# Patient Record
Sex: Male | Born: 1950 | Race: Black or African American | Hispanic: No | Marital: Single
Health system: Southern US, Community
[De-identification: ages and names within clinical notes are randomized; demographics above are authoritative.]

---

## 2011-12-19 ENCOUNTER — Ambulatory Visit: Payer: Self-pay

## 2012-10-06 ENCOUNTER — Ambulatory Visit: Payer: Self-pay | Admitting: Radiation Oncology

## 2012-10-17 ENCOUNTER — Ambulatory Visit: Payer: Self-pay | Admitting: Radiation Oncology

## 2012-11-17 ENCOUNTER — Ambulatory Visit: Payer: Self-pay | Admitting: Radiation Oncology

## 2013-03-26 ENCOUNTER — Ambulatory Visit: Payer: Self-pay | Admitting: Radiation Oncology

## 2013-04-19 ENCOUNTER — Ambulatory Visit: Payer: Self-pay | Admitting: Radiation Oncology

## 2013-04-24 ENCOUNTER — Inpatient Hospital Stay: Payer: Self-pay | Admitting: Internal Medicine

## 2013-04-24 LAB — URINALYSIS, COMPLETE
Bilirubin,UR: NEGATIVE
Blood: NEGATIVE
Glucose,UR: NEGATIVE mg/dL (ref 0–75)
Ketone: NEGATIVE
LEUKOCYTE ESTERASE: NEGATIVE
NITRITE: NEGATIVE
Ph: 5 (ref 4.5–8.0)
Protein: 30
RBC, UR: NONE SEEN /HPF (ref 0–5)
Specific Gravity: 1.021 (ref 1.003–1.030)

## 2013-04-24 LAB — CBC
HCT: 34 % — AB (ref 40.0–52.0)
HGB: 11.5 g/dL — ABNORMAL LOW (ref 13.0–18.0)
MCH: 27.5 pg (ref 26.0–34.0)
MCHC: 33.9 g/dL (ref 32.0–36.0)
MCV: 81 fL (ref 80–100)
Platelet: 292 10*3/uL (ref 150–440)
RBC: 4.19 10*6/uL — ABNORMAL LOW (ref 4.40–5.90)
RDW: 17.4 % — ABNORMAL HIGH (ref 11.5–14.5)
WBC: 7.6 10*3/uL (ref 3.8–10.6)

## 2013-04-24 LAB — BASIC METABOLIC PANEL
Anion Gap: 16 (ref 7–16)
BUN: 49 mg/dL — AB (ref 7–18)
CALCIUM: 9.9 mg/dL (ref 8.5–10.1)
CHLORIDE: 97 mmol/L — AB (ref 98–107)
CREATININE: 3.04 mg/dL — AB (ref 0.60–1.30)
Co2: 20 mmol/L — ABNORMAL LOW (ref 21–32)
EGFR (African American): 25 — ABNORMAL LOW
EGFR (Non-African Amer.): 21 — ABNORMAL LOW
Glucose: 246 mg/dL — ABNORMAL HIGH (ref 65–99)
Osmolality: 288 (ref 275–301)
Potassium: 3.2 mmol/L — ABNORMAL LOW (ref 3.5–5.1)
Sodium: 133 mmol/L — ABNORMAL LOW (ref 136–145)

## 2013-04-24 LAB — TROPONIN I: Troponin-I: <0.02 ng/mL

## 2013-04-25 LAB — BASIC METABOLIC PANEL
Anion Gap: 12 (ref 7–16)
BUN: 48 mg/dL — AB (ref 7–18)
CO2: 17 mmol/L — AB (ref 21–32)
CREATININE: 2.46 mg/dL — AB (ref 0.60–1.30)
Calcium, Total: 9.6 mg/dL (ref 8.5–10.1)
Chloride: 105 mmol/L (ref 98–107)
GFR CALC AF AMER: 32 — AB
GFR CALC NON AF AMER: 27 — AB
Glucose: 96 mg/dL (ref 65–99)
OSMOLALITY: 281 (ref 275–301)
Potassium: 3.8 mmol/L (ref 3.5–5.1)
SODIUM: 134 mmol/L — AB (ref 136–145)

## 2013-04-25 LAB — CBC WITH DIFFERENTIAL/PLATELET
Basophil #: 0 10*3/uL (ref 0.0–0.1)
Basophil %: 0.6 %
Eosinophil #: 0 10*3/uL (ref 0.0–0.7)
Eosinophil %: 0.4 %
HCT: 29.3 % — AB (ref 40.0–52.0)
HGB: 9.8 g/dL — AB (ref 13.0–18.0)
LYMPHS ABS: 1.2 10*3/uL (ref 1.0–3.6)
Lymphocyte %: 19.1 %
MCH: 26.6 pg (ref 26.0–34.0)
MCHC: 33.3 g/dL (ref 32.0–36.0)
MCV: 80 fL (ref 80–100)
MONO ABS: 0.3 x10 3/mm (ref 0.2–1.0)
MONOS PCT: 4.6 %
NEUTROS ABS: 4.6 10*3/uL (ref 1.4–6.5)
Neutrophil %: 75.3 %
Platelet: 224 10*3/uL (ref 150–440)
RBC: 3.67 10*6/uL — AB (ref 4.40–5.90)
RDW: 17 % — ABNORMAL HIGH (ref 11.5–14.5)
WBC: 6.2 10*3/uL (ref 3.8–10.6)

## 2013-04-26 LAB — BASIC METABOLIC PANEL
ANION GAP: 7 (ref 7–16)
BUN: 36 mg/dL — ABNORMAL HIGH (ref 7–18)
CHLORIDE: 112 mmol/L — AB (ref 98–107)
CO2: 17 mmol/L — AB (ref 21–32)
Calcium, Total: 9 mg/dL (ref 8.5–10.1)
Creatinine: 1.06 mg/dL (ref 0.60–1.30)
EGFR (African American): >60
EGFR (Non-African Amer.): >60
Glucose: 83 mg/dL (ref 65–99)
Osmolality: 279 (ref 275–301)
POTASSIUM: 4.9 mmol/L (ref 3.5–5.1)
SODIUM: 136 mmol/L (ref 136–145)

## 2013-06-17 ENCOUNTER — Ambulatory Visit: Payer: Self-pay | Admitting: Internal Medicine

## 2013-07-10 ENCOUNTER — Inpatient Hospital Stay: Payer: Self-pay | Admitting: Internal Medicine

## 2013-07-10 LAB — CBC WITH DIFFERENTIAL/PLATELET
BANDS NEUTROPHIL: 5 %
EOS PCT: 1 %
HCT: 11.5 % — AB (ref 40.0–52.0)
HGB: 3.7 g/dL — CL (ref 13.0–18.0)
LYMPHS PCT: 16 %
MCH: 25.9 pg — ABNORMAL LOW (ref 26.0–34.0)
MCHC: 31.9 g/dL — AB (ref 32.0–36.0)
MCV: 81 fL (ref 80–100)
Monocytes: 3 %
Myelocyte: 1 %
NRBC/100 WBC: 1 /
PLATELETS: 121 10*3/uL — AB (ref 150–440)
RBC: 1.42 10*6/uL — ABNORMAL LOW (ref 4.40–5.90)
RDW: 19.2 % — AB (ref 11.5–14.5)
SEGMENTED NEUTROPHILS: 74 %
WBC: 5.4 10*3/uL (ref 3.8–10.6)

## 2013-07-10 LAB — BASIC METABOLIC PANEL
ANION GAP: 9 (ref 7–16)
BUN: 30 mg/dL — ABNORMAL HIGH (ref 7–18)
CALCIUM: 8.4 mg/dL — AB (ref 8.5–10.1)
CHLORIDE: 102 mmol/L (ref 98–107)
CREATININE: 0.86 mg/dL (ref 0.60–1.30)
Co2: 24 mmol/L (ref 21–32)
EGFR (African American): >60
EGFR (Non-African Amer.): >60
Glucose: 125 mg/dL — ABNORMAL HIGH (ref 65–99)
OSMOLALITY: 278 (ref 275–301)
Potassium: 4 mmol/L (ref 3.5–5.1)
Sodium: 135 mmol/L — ABNORMAL LOW (ref 136–145)

## 2013-07-10 LAB — HEPATIC FUNCTION PANEL A (ARMC)
AST: 62 U/L — AB (ref 15–37)
Albumin: 2.1 g/dL — ABNORMAL LOW (ref 3.4–5.0)
Alkaline Phosphatase: 422 U/L — ABNORMAL HIGH
BILIRUBIN DIRECT: 0.1 mg/dL (ref 0.00–0.20)
Bilirubin,Total: 0.3 mg/dL (ref 0.2–1.0)
SGPT (ALT): 11 U/L — ABNORMAL LOW (ref 12–78)
Total Protein: 6.9 g/dL (ref 6.4–8.2)

## 2013-07-11 LAB — CBC WITH DIFFERENTIAL/PLATELET
BANDS NEUTROPHIL: 4 %
BASOS ABS: 1 %
BASOS ABS: 0 10*3/uL (ref 0.0–0.1)
Basophil %: 0.6 %
EOS PCT: 1 %
Eosinophil #: 0 10*3/uL (ref 0.0–0.7)
Eosinophil %: 0.5 %
HCT: 21.2 % — ABNORMAL LOW (ref 40.0–52.0)
HCT: 23.2 % — ABNORMAL LOW (ref 40.0–52.0)
HGB: 7 g/dL — ABNORMAL LOW (ref 13.0–18.0)
HGB: 7.7 g/dL — AB (ref 13.0–18.0)
LYMPHS ABS: 0.6 10*3/uL — AB (ref 1.0–3.6)
Lymphocyte %: 11.3 %
Lymphocytes: 10 %
MCH: 29.2 pg (ref 26.0–34.0)
MCH: 29.4 pg (ref 26.0–34.0)
MCHC: 33.1 g/dL (ref 32.0–36.0)
MCHC: 33.3 g/dL (ref 32.0–36.0)
MCV: 88 fL (ref 80–100)
MCV: 88 fL (ref 80–100)
MONOS PCT: 10 %
Metamyelocyte: 4 %
Monocyte #: 0.2 x10 3/mm (ref 0.2–1.0)
Monocyte %: 4.4 %
Myelocyte: 3 %
Neutrophil #: 4.6 10*3/uL (ref 1.4–6.5)
Neutrophil %: 83.2 %
Platelet: 88 10*3/uL — ABNORMAL LOW (ref 150–440)
Platelet: 100 10*3/uL — ABNORMAL LOW (ref 150–440)
RBC: 2.4 10*6/uL — ABNORMAL LOW (ref 4.40–5.90)
RBC: 2.63 10*6/uL — ABNORMAL LOW (ref 4.40–5.90)
RDW: 17.4 % — ABNORMAL HIGH (ref 11.5–14.5)
RDW: 17 % — ABNORMAL HIGH (ref 11.5–14.5)
SEGMENTED NEUTROPHILS: 67 %
WBC: 5.5 10*3/uL (ref 3.8–10.6)
WBC: 5.5 10*3/uL (ref 3.8–10.6)

## 2013-07-11 LAB — BASIC METABOLIC PANEL
Anion Gap: 8 (ref 7–16)
BUN: 23 mg/dL — ABNORMAL HIGH (ref 7–18)
CHLORIDE: 107 mmol/L (ref 98–107)
Calcium, Total: 8.6 mg/dL (ref 8.5–10.1)
Co2: 22 mmol/L (ref 21–32)
Creatinine: 0.73 mg/dL (ref 0.60–1.30)
EGFR (African American): >60
EGFR (Non-African Amer.): >60
Glucose: 89 mg/dL (ref 65–99)
Osmolality: 277 (ref 275–301)
POTASSIUM: 3.7 mmol/L (ref 3.5–5.1)
Sodium: 137 mmol/L (ref 136–145)

## 2013-07-12 LAB — CBC WITH DIFFERENTIAL/PLATELET
BANDS NEUTROPHIL: 6 %
EOS PCT: 2 %
HCT: 21.8 % — AB (ref 40.0–52.0)
HGB: 7.2 g/dL — ABNORMAL LOW (ref 13.0–18.0)
LYMPHS PCT: 15 %
MCH: 28.8 pg (ref 26.0–34.0)
MCHC: 32.9 g/dL (ref 32.0–36.0)
MCV: 88 fL (ref 80–100)
MONOS PCT: 5 %
Metamyelocyte: 3 %
Myelocyte: 2 %
Platelet: 86 10*3/uL — ABNORMAL LOW (ref 150–440)
RBC: 2.49 10*6/uL — ABNORMAL LOW (ref 4.40–5.90)
RDW: 17.8 % — ABNORMAL HIGH (ref 11.5–14.5)
Segmented Neutrophils: 67 %
WBC: 4.7 10*3/uL (ref 3.8–10.6)

## 2013-07-17 ENCOUNTER — Ambulatory Visit: Payer: Self-pay | Admitting: Internal Medicine

## 2013-08-17 DEATH — deceased

## 2013-09-29 ENCOUNTER — Ambulatory Visit: Payer: Self-pay | Admitting: Radiation Oncology

## 2013-11-08 IMAGING — CR RIGHT FOOT COMPLETE - 3+ VIEW
1 series · 3 of 3 positions shown · non-contrast
Comparison: none

REASON FOR EXAM: prostate cancer with unknown stage
COMMENTS:

PROCEDURE:     DXR - DXR FOOT RT COMPLETE W/OBLIQUES  - December 19, 2011 [DATE]
RESULT:     Comparison:  None

[Series 1: ap · 0.17mm/px · 3 of 3 slices shown]
[im 1/3]
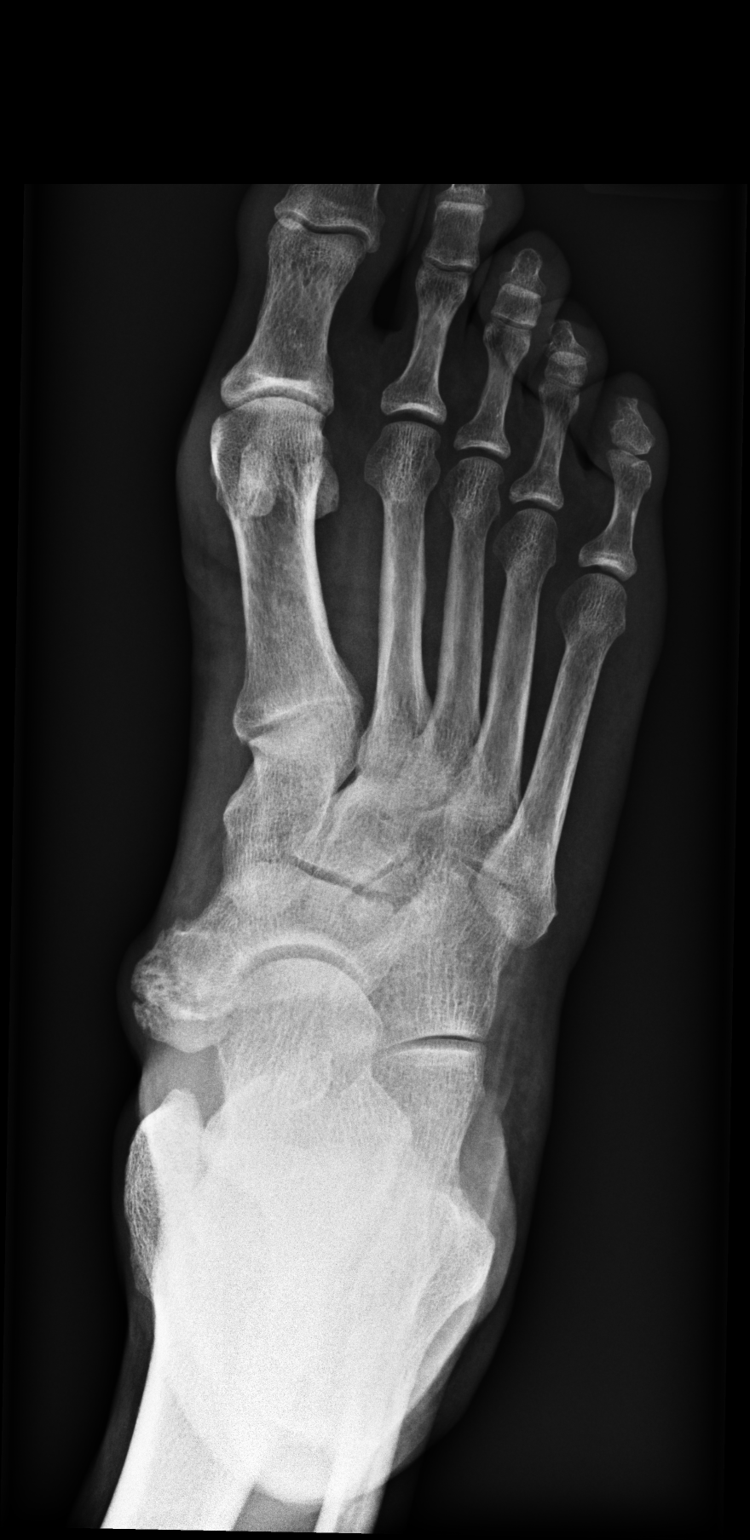
[im 2/3]
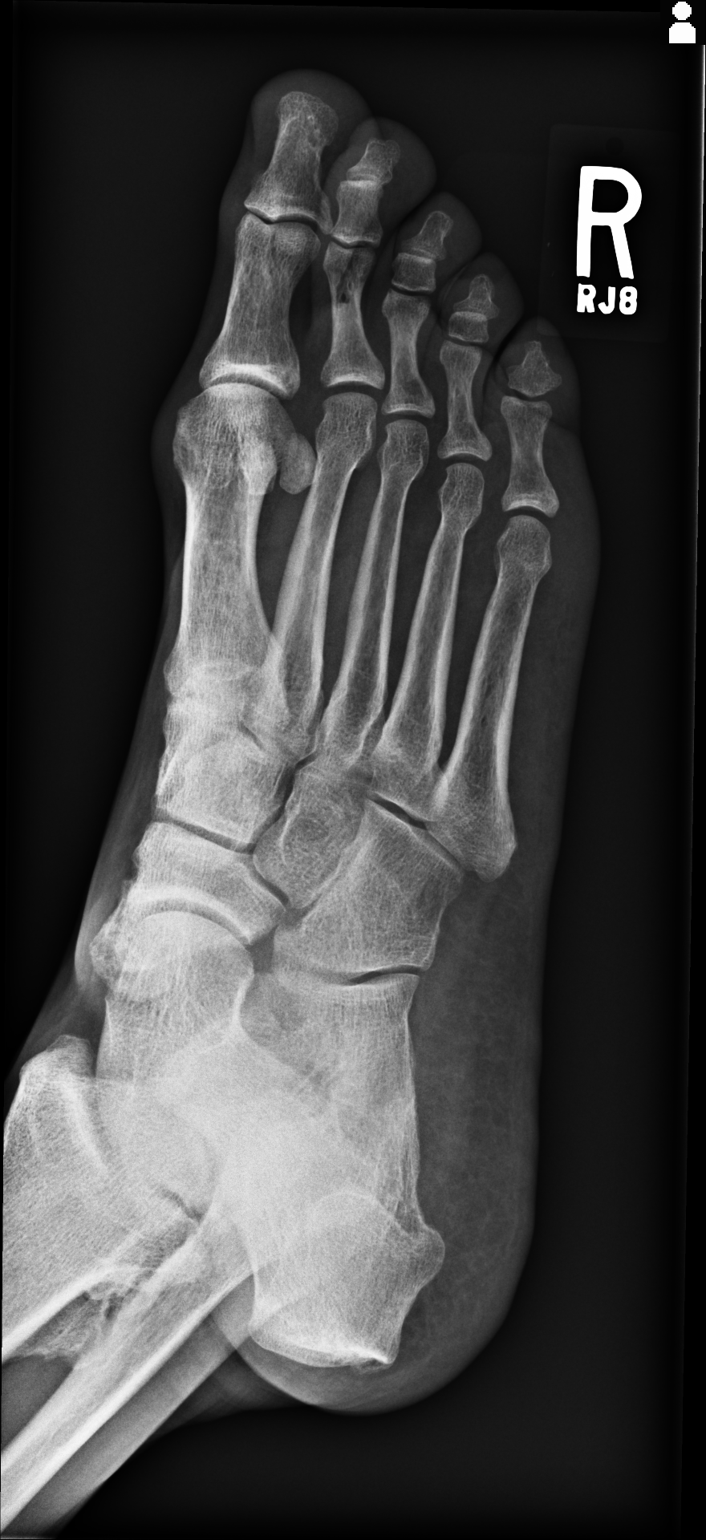
[im 3/3]
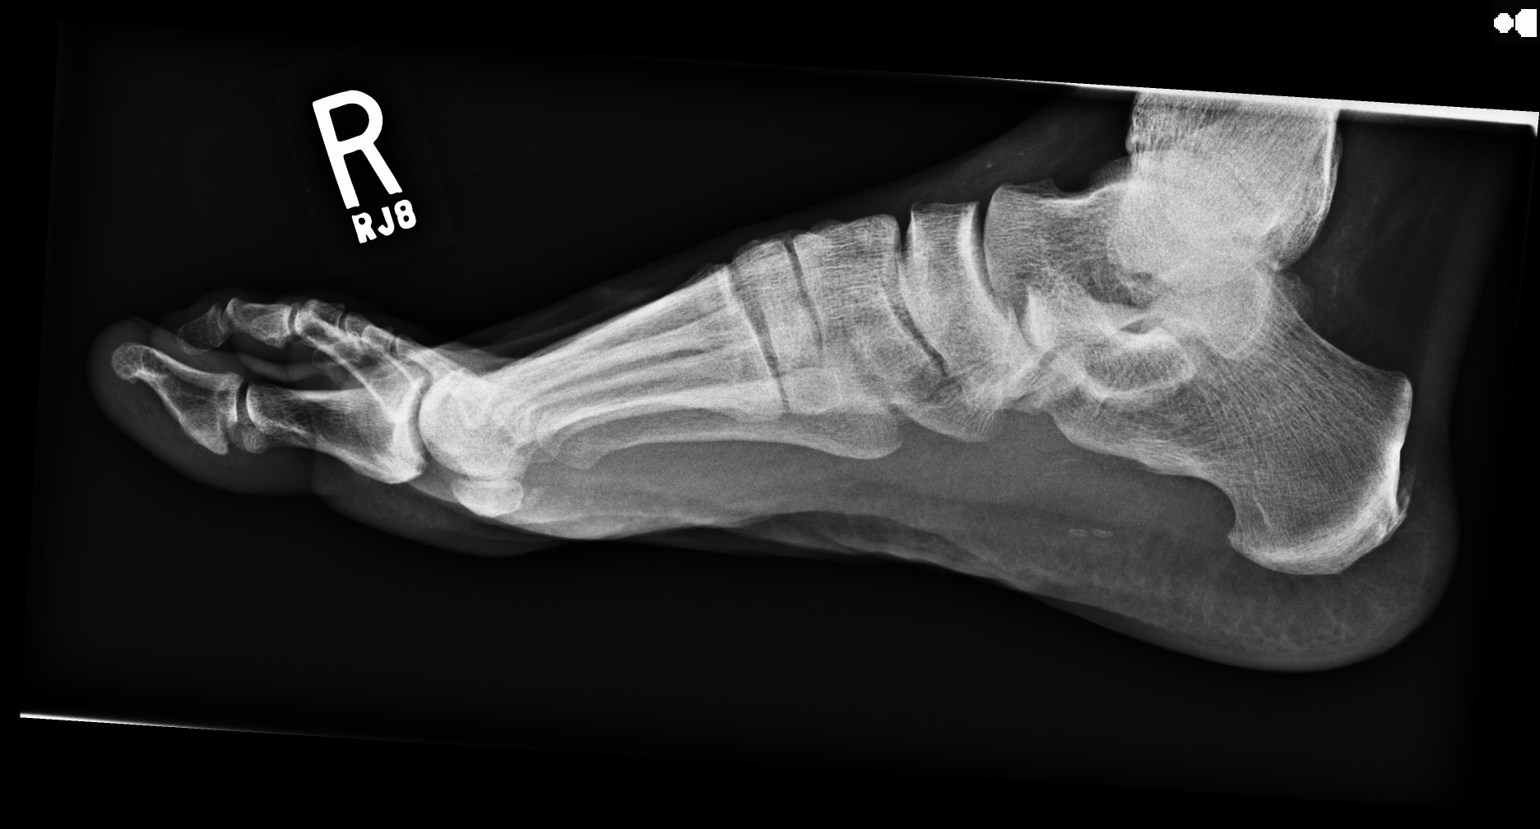

[3 of 3 positions shown; findings below may reference images not displayed]

FINDINGS: AP, oblique, and lateral views of the right foot demonstrates no fracture or
dislocation. There is no soft tissue abnormality. There is no subcutaneous
emphysema or radiopaque foreign bodies.
IMPRESSION: No acute osseous injury of the right foot.

[REDACTED]

## 2014-07-09 NOTE — Consult Note (Signed)
Reason for Visit: This 64 year old Male patient presents to the clinic for initial evaluation of  stage IV prostate cancer .   Referred by [facility].  Diagnosis:  Chief Complaint/Diagnosis   patient is a 64 year old male with stage IV prostate cancer increasing left hip pain with plain films showing lytic lesion in the left proximal femur  Imaging Report bone scan has been ordered   Referral Report clinical notes are reviewed   Planned Treatment Regimen palliative radiation therapy to left hip   HPI   patient is a 64 year old male diagnosed about a year prior with stage IV bone involvement prostate cancer.is has bto missing some of his Lupron injections according to their notes. He is starting to have left hip pain and difficulty ambulating and again plain films show lytic blastic component of metastatic disease in his left proximal femur. Patient is asked to be treated with palliative radiation closer to home and is seen today for consultation. He is having no other areas of bony pain at this time. He has never had a bone scan.  Past Hx:    Hypertension:    Prostate Cancer:   Past, Family and Social History:  Past Medical History positive   Cardiovascular hypertension   Genitourinary prostate cancer   Family History noncontributory   Social History positive   Social History Comments significant EtOH use history no smoking history   Additional Past Medical and Surgical History seen by himself today   Allergies:   No Known Allergies:   Home Meds:  Home Medications: Medication Instructions Status  Norvasc 10 mg oral tablet 1 tab(s) orally once a day Active   Review of Systems:  General negative   Performance Status (ECOG) 0   Skin negative   Breast negative   Ophthalmologic negative   ENMT negative   Respiratory and Thorax negative   Gastrointestinal negative   Genitourinary see HPI   Neurological negative   Psychiatric negative    Hematology/Lymphatics negative   Endocrine negative   Allergic/Immunologic negative   Nursing Notes:  Nursing Vital Signs and Chemo Nursing Nursing Notes: *CC Vital Signs Flowsheet:   21-Jul-14 14:45  Temp Temperature 97.8  Pulse Pulse 115  Respirations Respirations 20  SBP SBP 118  DBP DBP 84  Current Weight (kg) (kg) 82.5   Physical Exam:  General/Skin/HEENT:  General normal   Skin normal   Eyes normal   ENMT normal   Head and Neck normal   Additional PE well-developed male in NAD. Lungs are clear to A&P cardiac examination shows regular rate and rhythm. Range of motion of the left lower extremity is hindered secondary to pain. He does apparently have good motor and sensory levels bilaterally in his lower extremities. He is also if favoring his left lower extremity and so true motor strength is hard to appreciate.   Breasts/Resp/CV/GI/GU:  Respiratory and Thorax normal   Cardiovascular normal   Gastrointestinal normal   Genitourinary normal   MS/Neuro/Psych/Lymph:  Musculoskeletal normal   Neurological normal   Lymphatics normal   Relevent Results:   Relevant Scans and Labs bone scan has been ordered.   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 I've similar CT simulation shortly after his bone scan is complete.  I will plan on delivering 3000 cGy in 10 fractions to his left left hip. Risks and benefits of treatment including skin reactions and fatigue old explained in detail to the patient. If there other areas of concern on bone scan may address those in the future date. Also may have with a medical oncologist your value with the patient for possible chemotherapy as recent studies have shown Taxol along with  hormone suppression therapy does increase overall survival in stage IV patients.  I would like to take this opportunity to thank you for allowing me to continue to participate in this patient's care.  Electronic Signatures: Rebeca Alert (MD)  (Signed 21-Jul-14 15:45)  Authored: HPI, Diagnosis, Past Hx, PFSH, Allergies, Home Meds, ROS, Nursing Notes, Physical Exam, Relevent Results, Encounter Assessment and Plan   Last Updated: 21-Jul-14 15:45 by Rebeca Alert (MD)

## 2014-07-09 NOTE — Consult Note (Signed)
PATIENT NAME:  Christopher Liu, Christopher Liu  DATE OF CONSULTATION:  12/11/2012  CONSULTING PHYSICIAN:  Maryln Gottronobert J. Selwyn Reason, MD  RADIATION ONCOLOGY FOLLOWUP NOTE  HISTORY: Mr. Christopher Liu is a pleasant 64 year old male who is seen today approximately 5 weeks following completion of palliative radiotherapy to his left hip in the management of his metastatic carcinoma of the prostate to bone. He continues to do well and essentially is pain-free along his left hip/pelvis. He is on no pain medication. I understand that he continues with his androgen-deprivation therapy with Lupron and Casodex, and I believe that he gets his medications from the [facility] in [place]. He was referred to Dr. Rushie Chestnuthrystal through [facility] [place]. He has a history of non- compliance with his androgen-deprivation therapy. He tells me that he is running out of his medication, and he will call the [facility] to get his necessary refills and to make sure that he is receiving his Lupron as scheduled. His only complaint today is that of occasional hot flashes and sweats, presumably from his androgen-deprivation therapy.   PHYSICAL EXAMINATION:  GENERAL: Alert and oriented.  VITAL SIGNS: Blood pressure 91/63, pulse 114, respiratory rate 20, temperature 97.7. HEAD AND NECK: Grossly unremarkable. NODES: Without palpable cervical or supraclavicular lymphadenopathy. CHEST: Lungs clear.  BACK: Without spinal or CVA tenderness.  ABDOMEN: Without masses or organomegaly. PELVIS: There is both hypopigmentation along his L inguinal fold and hyperpigmentation along his left groin/hip. EXTREMITIES: He has excellent range of motion of his hips/lower extremities.  NEUROLOGIC: Grossly nonfocal.  IMPRESSION: Satisfactory palliation following his left hip radiation therapy.  PLAN: He is to [facility] to get his medications refilled, and return to see Dr. Rushie Chestnuthrystal for a followup visit in 3 months. I  am concerned about his compliance.   ____________________________ Maryln Gottronobert J. Amaal Dimartino, MD rjm:OSi D: 12/11/2012 12:06:39 ET T: 12/11/2012 12:25:11 ET JOB#: [id number]  cc: Maryln Gottronobert J. Rosalynn Sergent, MD, <Dictator> Rebeca AlertGlenn S. Chrystal, MD Maryln GottronOBERT J Lilygrace Rodick MD ELECTRONICALLY SIGNED 12/11/2012 15:01

## 2014-07-10 NOTE — H&P (Signed)
PATIENT NAME:  Christopher Liu, Gillie MR#:  [id number] DATE OF BIRTH:  January 27, 1951  DATE OF ADMISSION:  07/10/2013  PRIMARY CARE PHYSICIAN: Marland McalpineSheikh A. Ellsworth Lennoxejan-Sie, MD   HISTORY OF PRESENT ILLNESS: This patient is a 64 year old male with history of metastatic prostate cancer, on home hospice,  brought in because of hypotension and generalized weakness. The patient went to see Dr. Ellsworth Lennoxejan-Sie today. He was sent in here because of hypotension. Blood pressure was 60/40 and heart rate was 125, and he had a near-syncopal episode. The patient has a history of metastatic prostate cancer with mets to bones, following with home hospice. Today went to see Dr. Ellsworth Lennoxejan-Sie for the first time. According to the caregiver, having generalized weakness for quite some time and not able to get up from the bed due to this extreme weakness. The patient also has very poor appetite. Denies any nausea or vomiting. No diarrhea and had normal bowel moment yesterday.   PAST MEDICAL HISTORY: Significant for history of hypertension and prostate cancer with mets to bones. The patient takes only morphine as per the hospice, but not on any medications.   SOCIAL HISTORY: Previous smoker. No drinking. No drugs. The patient has a caregiver at home and follows with hospice at home.   FAMILY HISTORY: Significant for hypertension.   MEDICATIONS: None, except morphine. The patient was taken off the medications, mainly prednisone, Zytiga, and followed by hospice for comfort care.   ALLERGIES: No known allergies.    PAST SURGICAL HISTORY: None.   REVIEW OF SYSTEMS:    CONSTITUTIONAL: Has fatigue, weakness.  EYES: No blurred vision.  EARS, NOSE, THROAT: No tinnitus. No ear pain. No epistaxis.  RESPIRATORY: No cough. No wheezing.  CARDIOVASCULAR: No chest pain. No orthopnea. No PND.  GASTROINTESTINAL: Has no nausea, no vomiting, no abdominal pain. Does have poor p.o. intake.  GENITOURINARY: No dysuria.  ENDOCRINE: No polyuria or nocturia.   HEMATOLOGIC: Does have anemia.  INTEGUMENTARY: No skin rashes.  MUSCULOSKELETAL: No joint pain.  NEUROLOGIC: No numbness or weakness.  PSYCHIATRIC: No anxiety or insomnia.   PHYSICAL EXAMINATION:  VITAL SIGNS: Temperature is 98.7, heart rate 128, blood pressure 93/57, sats 94% on room air. GENERAL: Alert, awake, oriented 64 year old male, very pale appearing, not in distress.  HEAD: Normocephalic, atraumatic.  EYES: Pupils equal, reacting to light. The conjunctivae are very pale. Extraocular movements intact.  NOSE: No nasal lesions. No drainage.  EARS: No tympanic membrane congestion.  MOUTH: No lesions. Mucous membranes dry. NECK: Supple. No JVD. No masses. Normal range of motion.  RESPIRATORY: Clear to auscultation. No wheeze. No rales.  CARDIOVASCULAR: S1, S2, regular, tachycardic.  MUSCULOSKELETAL: The patient's extremities move x4. Gait not tested because of the weakness and near-syncope. SKIN: Decreased skin turgor.  LYMPH: No lymphadenopathy.  NEUROLOGIC: Cranial nerves II through XII are intact. Power 5/5 in upper and lower extremities. Sensation intact. DTRs 2+ bilaterally.  PSYCHIATRIC: Mood and affect are within normal limits.  LABORATORY AND RADIOLOGICAL DATA: WBC 5.4, hemoglobin 3.7, hematocrit 11.5, platelets 121. Electrolytes: Sodium is 135; potassium is 4, chloride 102, bicarbonate 24, BUN  30 creatinine 0.86, glucose 125, alkaline phosphatase 422, ALT 11, AST 62. The patient's albumin is 2.1. Chest x-ray shows diffuse sclerotic mets   no acute cardiopulmonay disease. .. The patient's latest hemoglobin was 9.8 in February 2015.  ASSESSMENT AND PLAN: The patient is a 64 year old male patient with metastatic prostate cancer, followed by the hospice, comes in with generalizedtic  weakness, hypotension, tachycardia, found to have acute  severe (symptomatic  anemia. The patient's near-syncope is likely secondary to severe acute anemia. Admit him to hospitalist service. Start him  on 3 units of blood transfusion along with IV fluids and repeat hemoglobin every 8 hours. The patient's condition was discussed with the patient and daughter. They want the patient to get blood transfusion. The patient was given option to have comfort care at home, but because of this anemia and symptoms, the patient's family chose treatment for blood transfusion and fluids. The patient will be admitted for this and monitor hemodynamics.   CODE STATUS: DO NOT RESUSCITATE.   TIME SPENT: 60 minutes.    ____________________________ Katha Hamming, MD sk:jcm D: 07/10/2013 18:36:23 ET T: 07/10/2013 20:28:37 ET JOB#: [id number]  cc: Katha Hamming, MD, <Dictator> Sheikh A. Ellsworth Lennox, MD Katha Hamming MD ELECTRONICALLY SIGNED 08/12/2013 16:08

## 2014-07-10 NOTE — Discharge Summary (Signed)
PATIENT NAME:  Christopher Liu, Christopher Liu MR#:  [id number] DATE OF BIRTH:  1950-09-23  DATE OF ADMISSION:  07/10/2013 DATE OF DISCHARGE:  07/13/2013  ADMITTING PHYSICIAN: Dr. Luberta MutterKonidena.  DISCHARGING PHYSICIAN; Dr. Ellsworth Lennoxejan-Sie.   DISCHARGE DIAGNOSES:  1.  Anemia of malignancy.  2.  Metastatic prostate cancer.   PROCEDURES: Blood transfusion.   CONSULTATIONS: Palliative care, Dr. Harvie JuniorPhifer.  IMAGING: Chest x-ray.   HOSPITAL COURSE: This gentleman was admitted through the Emergency Room after presenting to my office with significant lethargy, hypotension and tachycardia. In the Emergency Room, he was found to have a hemoglobin of 3 due to metastatic prostatic cancer and was supposed to be on palliative care at home. The patient was admitted to the medical floor. He received packed red cells 4 units with appropriate rise in his hemoglobin. His blood pressure normalized and hospice and palliative care consultations were placed. The patient'Lorette Peterkin family declined hospice home and requested continuation of home hospice care. This was set up for the patient. His hospital stay was otherwise uncomplicated. The disease process was explained to him in his family. His analgesia was optimized by addition of OxyContin and p.r.n. Norco.   DISCHARGE MEDICATIONS: Please refer to discharge medication reconciliation.   DISCHARGE INSTRUCTIONS:  DIET: Regular.   ACTIVITY: As tolerated.  FOLLOWUP: In 1 to 2 weeks.   TIME SPENT: 35 minutes.  ____________________________ Silas FloodSheikh A. Ellsworth Lennoxejan-Sie, MD sat:aw D: 07/27/2013 13:49:28 ET T: 07/27/2013 14:12:59 ET JOB#: [id number]  cc: Sheikh A. Ellsworth Lennoxejan-Sie, MD, <Dictator> Charlesetta GaribaldiSHEIKH A TEJAN-SIE MD ELECTRONICALLY SIGNED 07/29/2013 13:43

## 2014-07-10 NOTE — H&P (Signed)
PATIENT NAME:  Christopher Liu, Christopher Liu MR#:  [id number] DATE OF BIRTH:  1950-10-05  DATE OF ADMISSION:  04/24/2013  PRIMARY CARE PHYSICIAN: None local.   REFERRING PHYSICIAN: Dr. Shaune Pollack.  CHIEF COMPLAINT: Passed out.  HISTORY OF PRESENT ILLNESS: Christopher Liu is a 64 year old pleasant African American male with past medical history of metastatic prostate cancer, currently on palliative care, is brought to the Emergency Department after having an episode of syncope. The patient was sitting on the commode, started to experience lightheadedness and fell down to the floor. The patient's caretaker, who lives with him, found him and called EMS. The patient states the episode lasted very brief, could be a few seconds. Denied having any chest pain, palpitations at the time. The patient states he has been experiencing these episodes whenever he stands up. The patient used to be 210 pounds, has lost 60 to 70 pounds in the last 6 months. The patient continued to take Norvasc. The patient's functional capacity has significantly decreased in the last few days.  his fluid intake. No obvious signs of infection are found. CT head without contrast was unremarkable. The patient had elevated BUN and creatinine compared to his baseline. The patient was also noted to be somewhat hypotensive with a systolic blood pressure in 90s. The patient received 1 liter of fluids in the Emergency Department with improvement of the blood pressure to 106/60. We do not have any patient's baseline creatinine.   PAST MEDICAL HISTORY: 1.  Hypertension.  2.  Prostate cancer.   ALLERGIES: No known drug allergies.   HOME MEDICATIONS: 1.  Zytiga 250 mg 4 tablets once a day.  2.  Prednisone 5 mg 2 times a day.  3.  Norvasc 10 mg once a day.   SOCIAL HISTORY: Former smoker. Denies drinking alcohol or using illicit drugs. Currently lives with a friend. Independent of ADLs. Currently on palliative care followup.   FAMILY HISTORY: History of hypertension.    REVIEW OF SYSTEMS:  CONSTITUTIONAL: Experiences severe generalized weakness.  EYES: No change in vision.  ENT: No change in hearing.  RESPIRATORY: No cough, shortness of breath.  CARDIOVASCULAR:  No chest pain, palpitations.  GASTROINTESTINAL: No nausea, vomiting, abdominal pain.  GENITOURINARY: No dysuria or hematuria.  ENDOCRINE: No polyuria or polydipsia.  HEMATOLOGIC: No easy bruising or bleeding.  SKIN: No rash or lesions.  MUSCULOSKELETAL: Has pelvic pain from the metastatic decease.  SKIN: No rash or lesions.  NEUROLOGIC: No weakness or numbness in any part of the body.   PHYSICAL EXAMINATION: GENERAL: This is a well-built, well-nourished, age-appropriate male lying down in the bed.  Looks lethargic.  VITAL SIGNS: Temperature 97.6, pulse 107, blood pressure 106/83, respiratory rate of 20, oxygen saturation 97% on room air.  HEENT: Normocephalic, atraumatic. There is no scleral icterus. Conjunctivae normal. Pupils equal and react to light. Extraocular movements are intact. Mucous membranes dry. No pharyngeal erythema.  NECK: Supple. No lymphadenopathy. No JVD. No carotid bruit. Has no thyromegaly.  CHEST:  Has no focal tenderness.  LUNGS: Bilaterally clear to auscultation.  HEART: S1, S2, regular, tachycardia. No pedal edema. Pulses 2+.  ABDOMEN: Bowel sounds present. Soft, nontender, nondistended. No hepatosplenomegaly.  SKIN: No rash or lesions.  MUSCULOSKELETAL: Good range of motion in all the extremities.  NEUROLOGIC: The patient is alert, oriented to place, person and time. Cranial nerves II through XII intact. Motor 5/5 in upper and lower extremities.   IMAGING AND LABORATORY DATA: UA negative for nitrites and leukocyte esterase. CT head without contrast:  Involutional changes without evidence of focal or acute intracranial abnormality.   Chest x-ray, one-view portable: No acute cardiopulmonary disease.   CBC: WBC of 7.6, hemoglobin 11.5, platelet count of 292.    CMP: BUN 49, creatinine of 3.04, potassium of 3.2.   ASSESSMENT AND PLAN:  Christopher Liu is a 64 year old male who comes to the Emergency Department after having an episode of syncope.  1.  Syncope: The patient is somewhat hypotensive, orthostatic positive. Most likely secondary to dehydration. Continue with IV fluids, for which the patient is responding currently. Admit the patient to a medical bed. The patient is currently on hospice care.   2.  Acute renal failure: We do not have any of patient's baselines. However, considering the patient's low blood pressure could have caused the ATN and prerenal. Will continue with IV fluids and follow up.  3.  Hypokalemia: Will replace by mouth.  4.  Metastatic disease of the prostate cancer, currently on palliative care.  5.  Hypertension: Will discontinue the Norvasc. The patient had significant weight loss in the last 6 months, which might have caused the patient's blood pressure to stay on the lower side. The patient has been experiencing lightheadedness for a long time. Discontinue the medication and follow up.  7.  Keep the patient on deep vein thrombosis prophylaxis with Lovenox.   TIME SPENT: 50 minutes.    ____________________________ Susa GriffinsPadmaja Cephus Tupy, MD pv:dmm D: 04/24/2013 22:12:59 ET T: 04/24/2013 22:59:51 ET JOB#: [id number]  cc: Susa GriffinsPadmaja Derico Mitton, MD, <Dictator> Susa GriffinsPADMAJA Daymen Hassebrock MD ELECTRONICALLY SIGNED 04/26/2013 4:22

## 2014-07-10 NOTE — Discharge Summary (Signed)
PATIENT NAME:  Christopher Liu, Joseguadalupe MR#:  [id number] DATE OF BIRTH:  [birth year]  DATE OF ADMISSION:  04/24/2013  DATE OF DISCHARGE:  04/27/2013  PRESENTING COMPLAINT: Passed out.  DISCHARGE DIAGNOSES: 1.  Acute renal failure, appears prerenal azotemia, resolved.  2.  History of metastatic prostate cancer.   CODE STATUS: NO CODE/DNR.    MEDICATIONS: 1.  Prednisone 5 mg b.i.d.  2.  Zytiga 250 mg 4 tablets once a day.  3.  Naproxen 500 mg 1 tablet b.i.d.  4.  Remeron 15 mg once a day at bedtime.  5.  Senna Plus 1 to 2 as needed.  6.  Zofran 4 mg 3 times a day as needed.  7.  Oxycodone extended release 10 mg p.o. b.i.d.  8.  Norco 320/5, 1  tablet every 4 hours as needed.  9.  Ensure Plus 3 times a day.   INSTRUCTIONS:  The patient advised not to take Norvasc.   Physical therapy.   [facility], patient will make appointment.   LABS AT DISCHARGE: Creatinine is 1.06. Creatinine on admission was 3.04. UA negative for UTI. H and H is 9.8 and 29.3, platelet count is 224, white count is 6.2.   BRIEF SUMMARY OF HOSPITAL COURSE: [name] is a 64 year old gentleman with history of metastatic prostate cancer, who came into the Emergency Room after having a syncopal episode. He was admitted with syncope, and was positive for orthostasis. It was likely due to dehydration. The patient is currently under hospice care. His symptoms improved after IV hydration.   1.  Acute renal failure, appears prerenal azotemia. The patient was started on IV fluids. He came in with creatinine of 3.10. It came down to 1.06 after IV hydration. The patient was tolerating p.o. diet and fluids well.   2.  Metastatic prostate cancer. Currently on palliative care. [facility].    3.  Hypertension. The patient's blood pressure remained relatively on the lower side. Given his syncopal episode, dehydration will hold off on starting again his home meds.   4.  Diaphoresis with sweating  spells for several months from androgen deprivation due to Lupron treatment in the past, with history of metastatic prostate cancer. The patient was [facility] with [facility].   5.  Physical Therapy saw the patient and recommended home PT due to his generalized weakness and deconditioning, which has been set up. Hospital stay otherwise remained stable.   CODE STATUS: The patient remained a full code.   TIME SPENT: 40 minutes.    ____________________________ Wylie HailSona A. Allena KatzPatel, MD sap:mr D: 04/28/2013 17:32:53 ET T: 04/28/2013 19:49:35 ET JOB#: [id number]  cc: Camy Leder A. Allena KatzPatel, MD, <Dictator> Willow OraSONA A Jamille Yoshino MD ELECTRONICALLY SIGNED 04/30/2013 13:25

## 2015-03-15 IMAGING — CR DG CHEST 2V
1 series · 2 of 2 positions shown · non-contrast
Comparison: Radionuclide bone scan dated 10/09/2012.

CLINICAL DATA: Syncope.  History of prostate cancer.

EXAM:
CHEST  2 VIEW

[Series 1: x chest ap · 0.14mm/px · 2 of 2 slices shown]
[im 1/2]
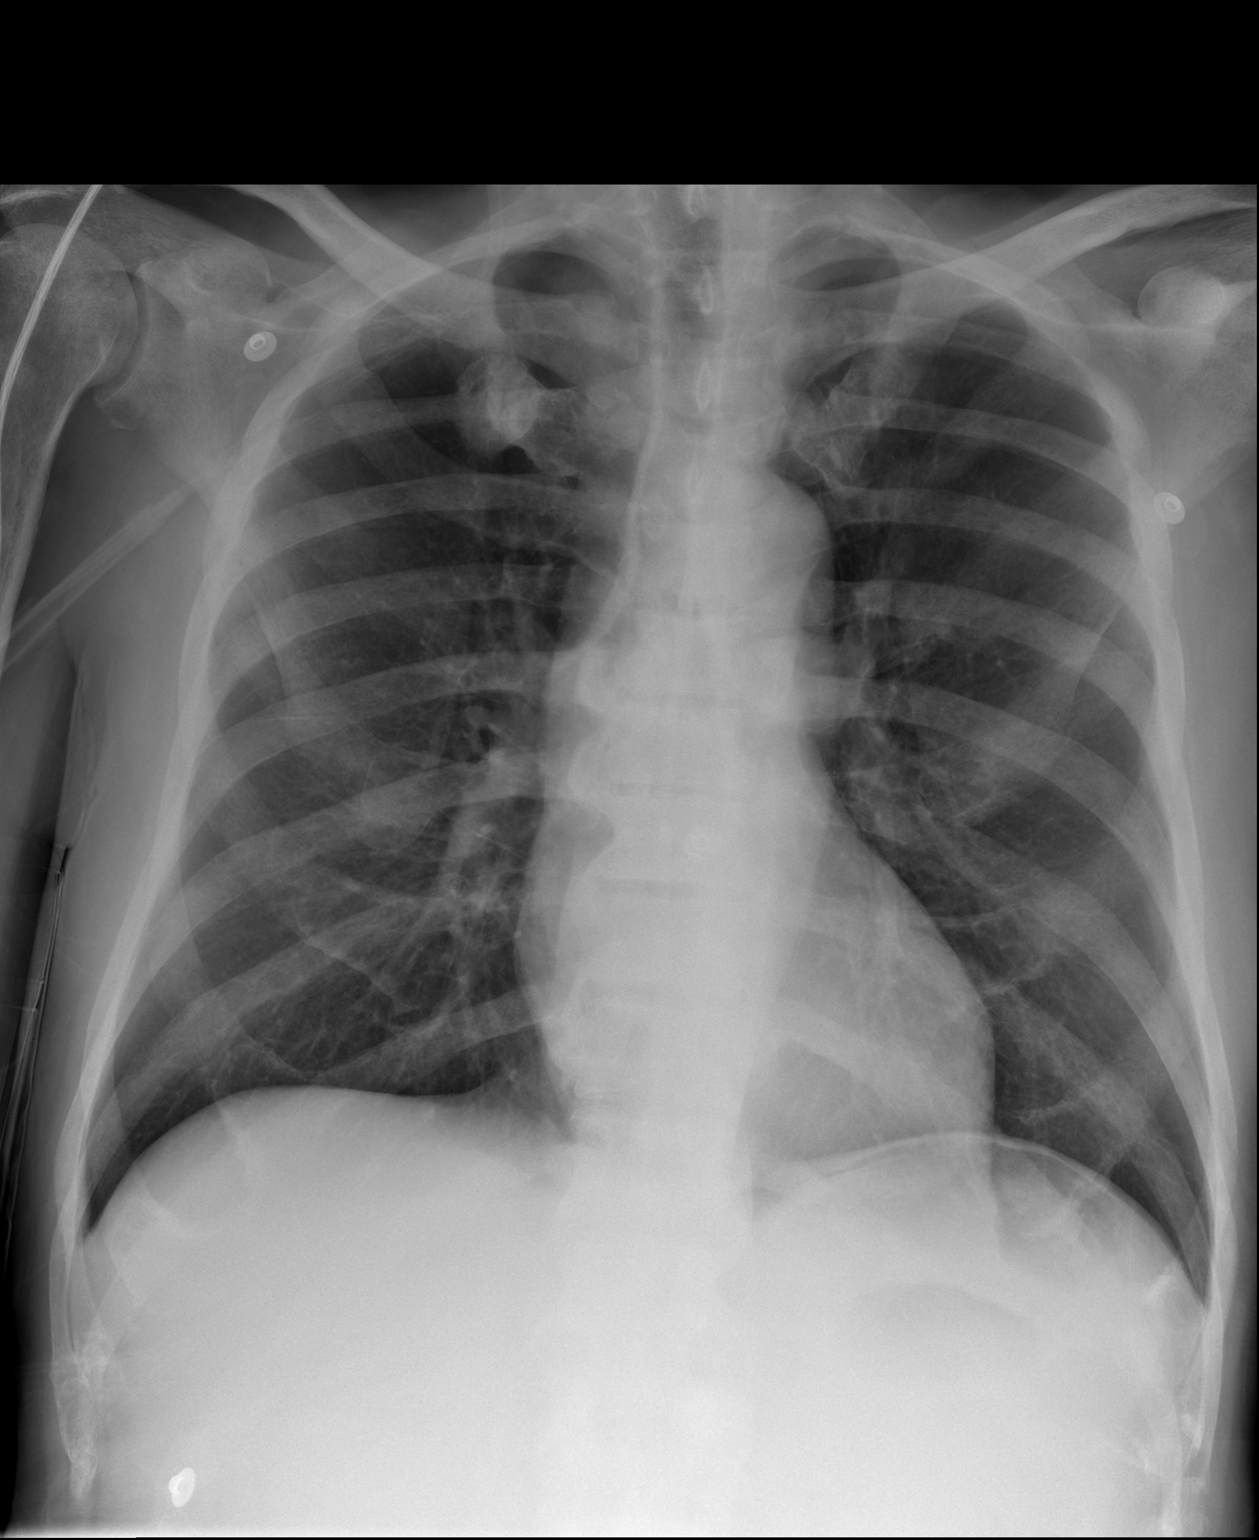
[im 2/2]
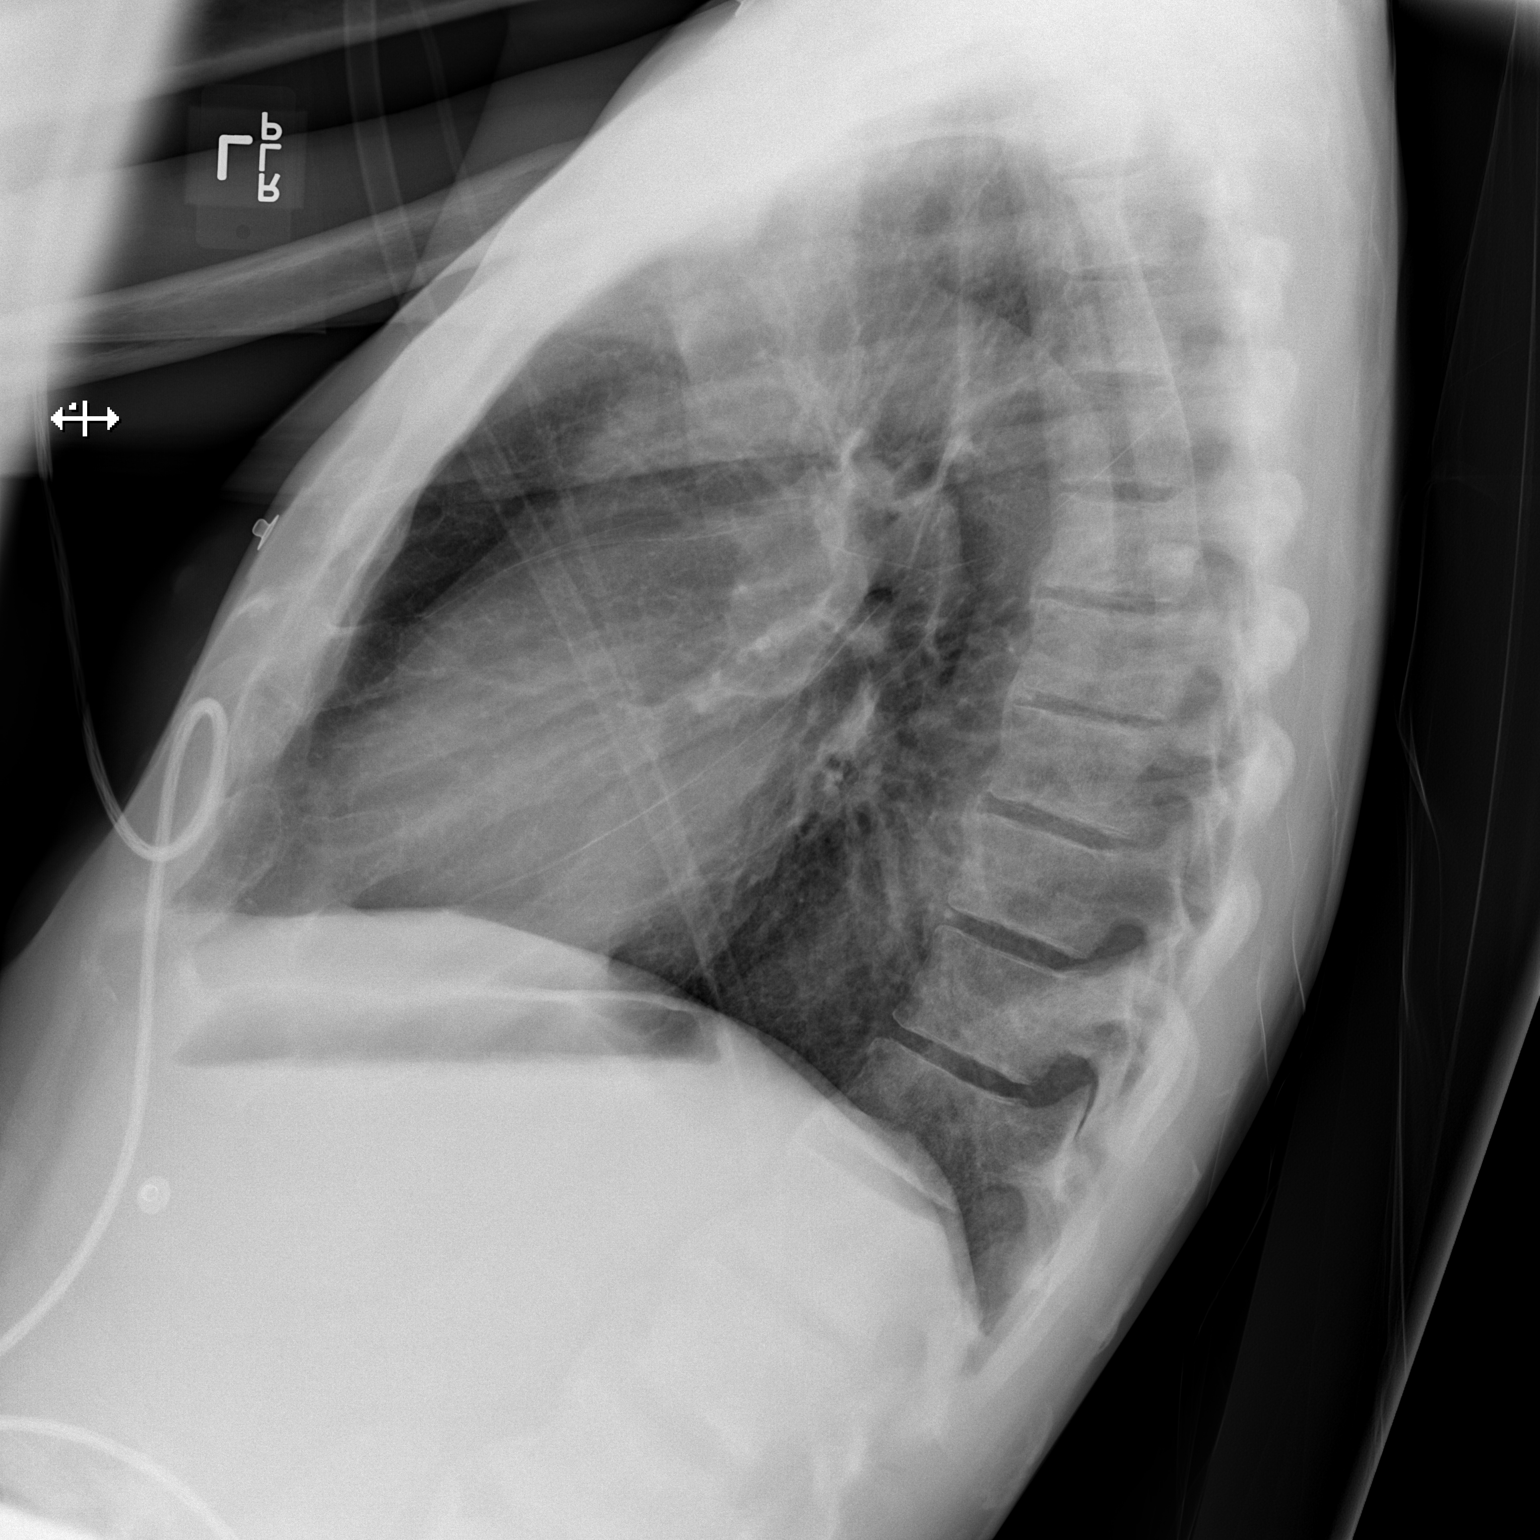

[2 of 2 positions shown; findings below may reference images not displayed]

FINDINGS: Normal sized heart. Clear lungs. Thoracic spine degenerative
changes. Ill-defined sclerotic lesions throughout the majority of
the bony skeleton. Lytic lesion in the right inferior glenoid.
IMPRESSION: No acute abnormality.  Extensive bony metastatic disease.

## 2015-03-15 IMAGING — CT CT HEAD WITHOUT CONTRAST
1 series · 16 of 30 positions shown, 20 images · non-contrast
Comparison: None.

CLINICAL DATA: History of syncopal episodes

EXAM:
CT HEAD WITHOUT CONTRAST
TECHNIQUE: Contiguous axial images were obtained from the base of the skull
through the vertex without intravenous contrast.

[Series 2: head wo · axial · 0.47mm/px · z∈[+377,+512]mm · 16 of 34 slices shown, 20 images]
[im 2/34  brain]
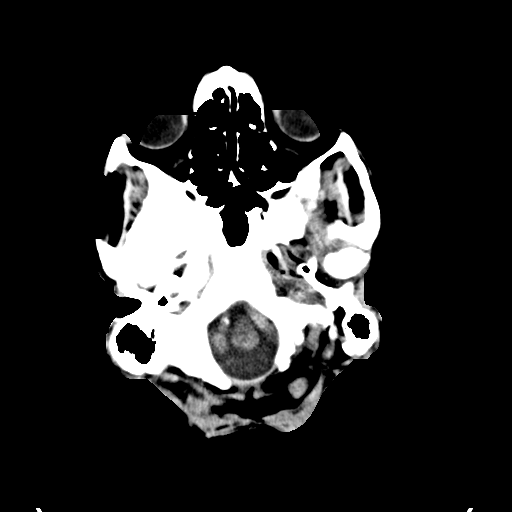
[im 2/34  bone]
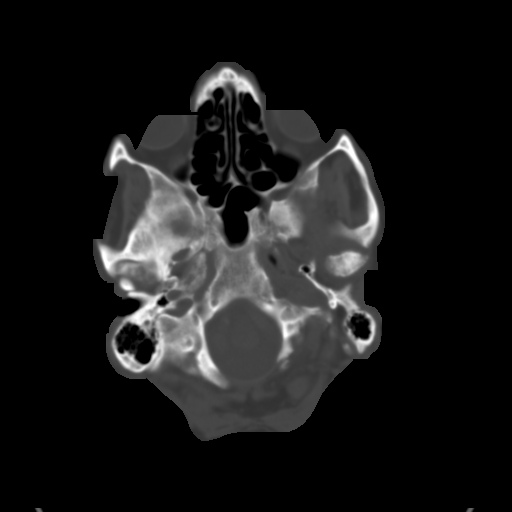
[im 4/34  brain]
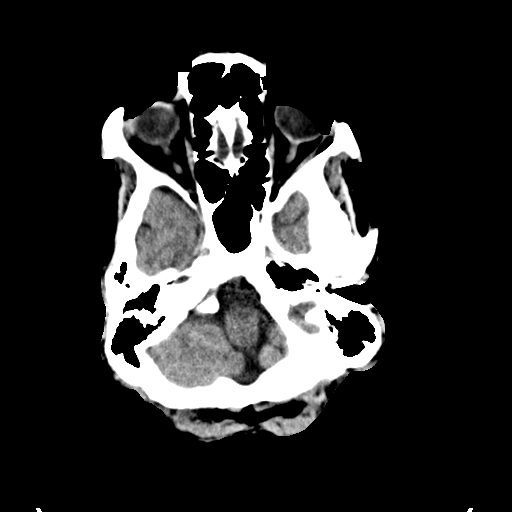
[im 6/34  brain]
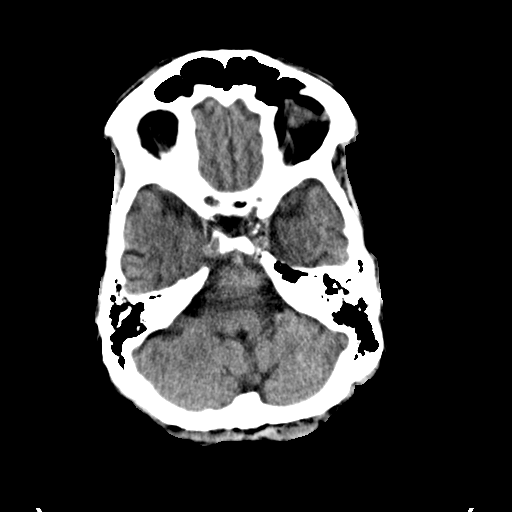
[im 8/34  brain]
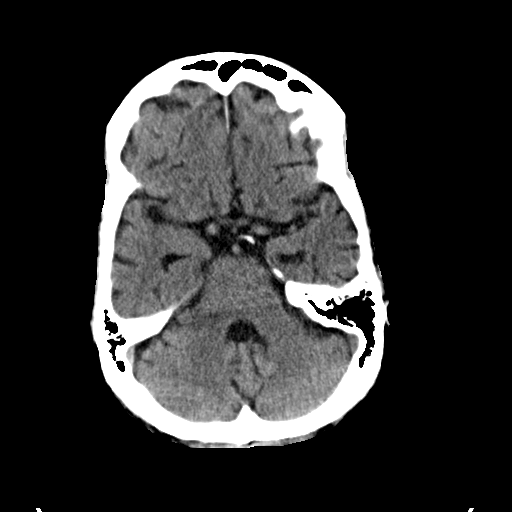
[im 10/34  brain]
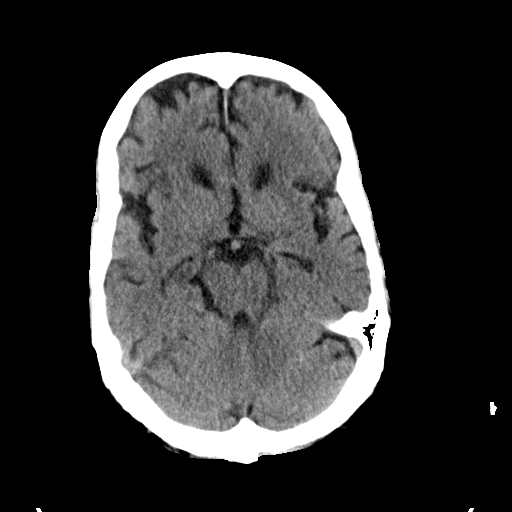
[im 10/34  bone]
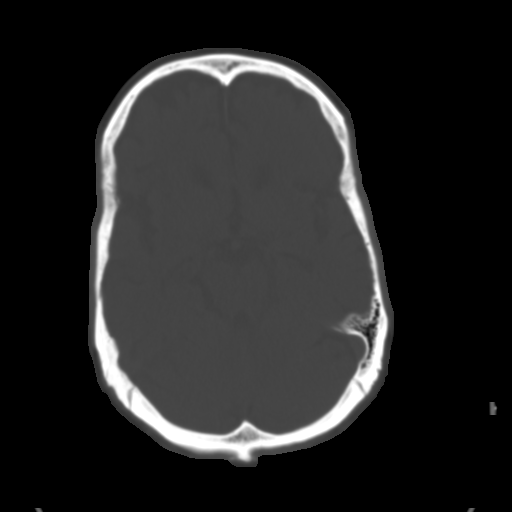
[im 12/34  brain]
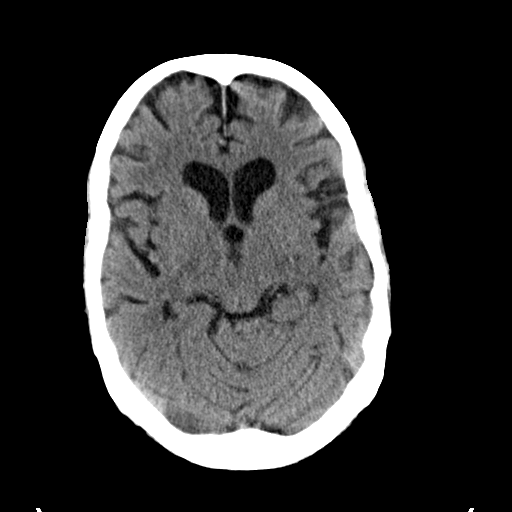
[im 14/34  brain]
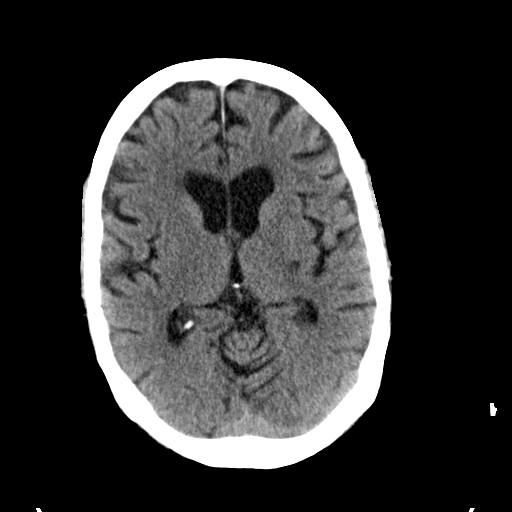
[im 16/34  brain]
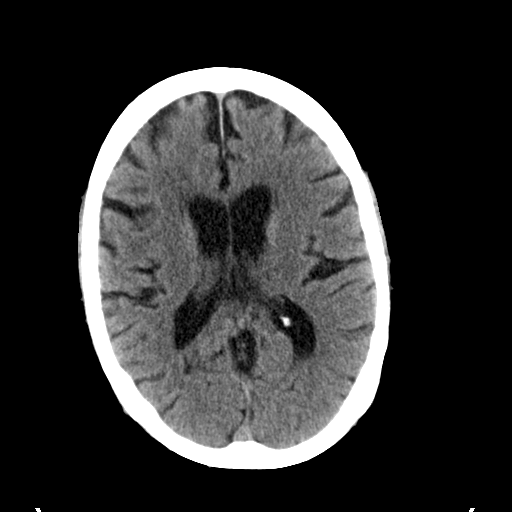
[im 18/34  brain]
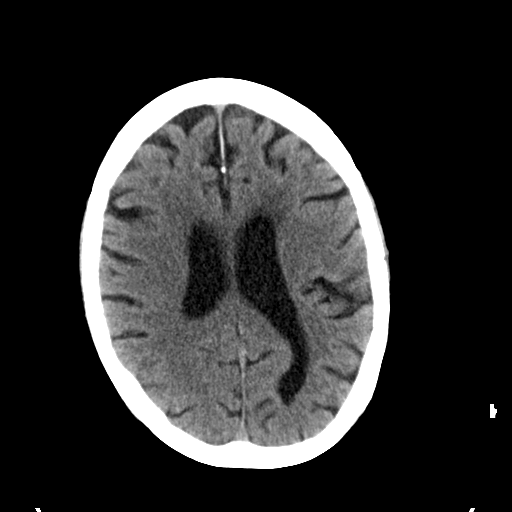
[im 18/34  bone]
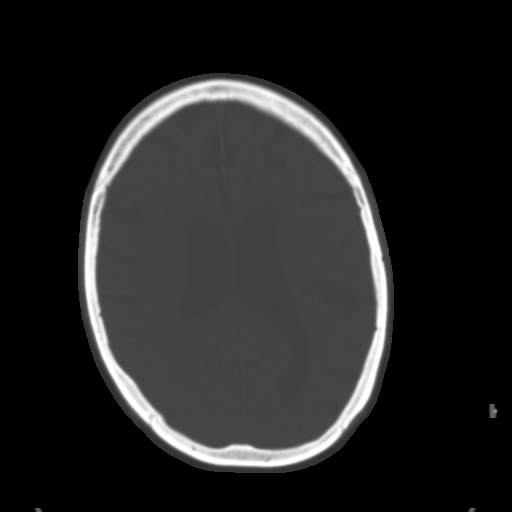
[im 20/34  brain]
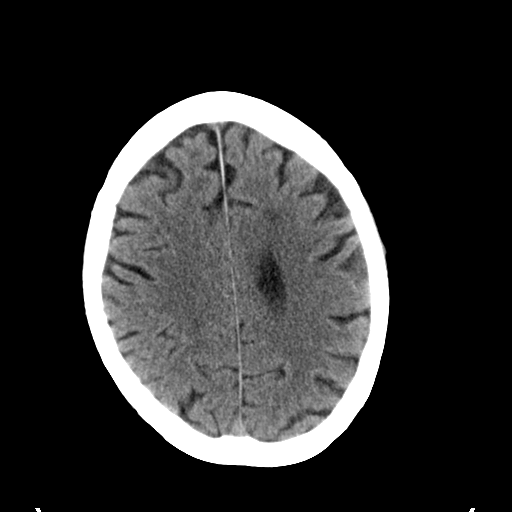
[im 22/34  brain]
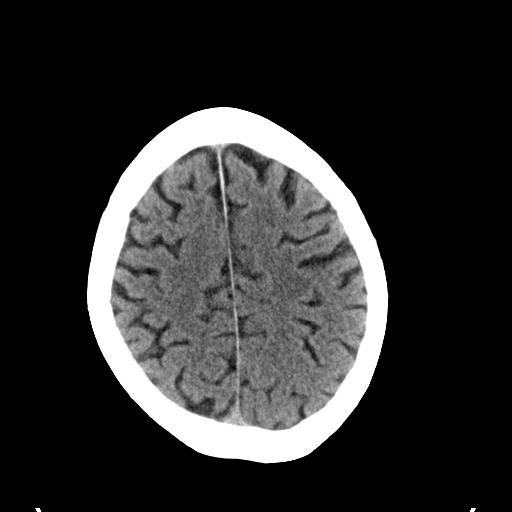
[im 24/34  brain]
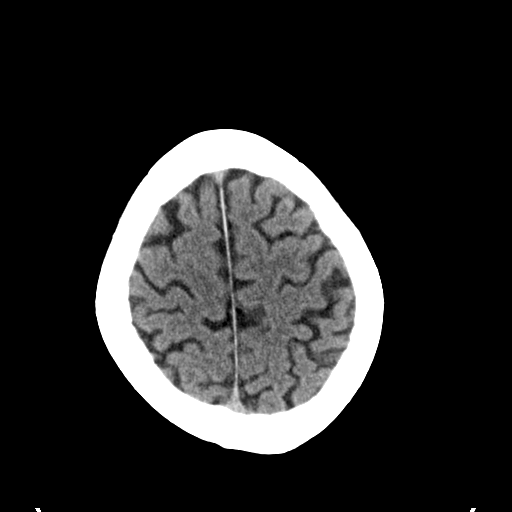
[im 26/34  brain]
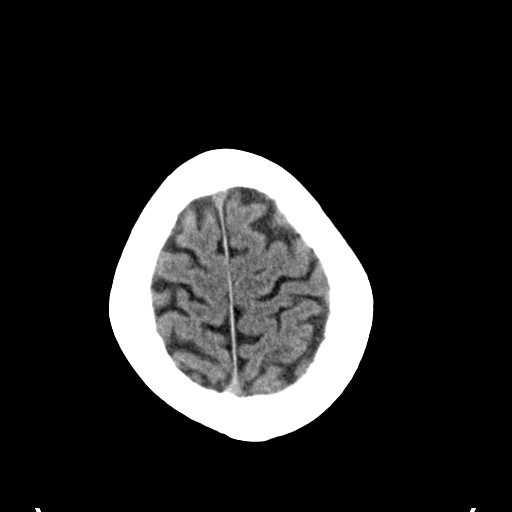
[im 26/34  bone]
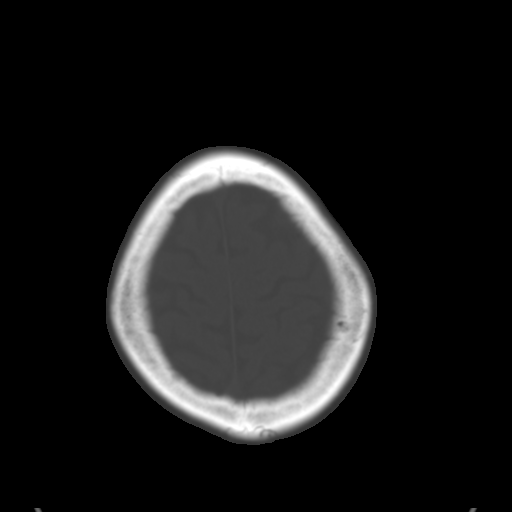
[im 28/34  brain]
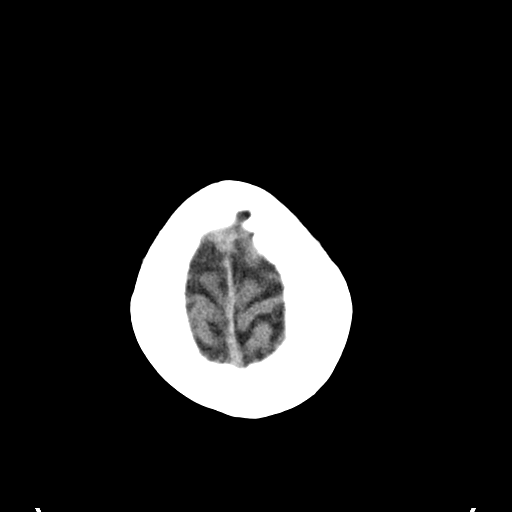
[im 30/34  brain]
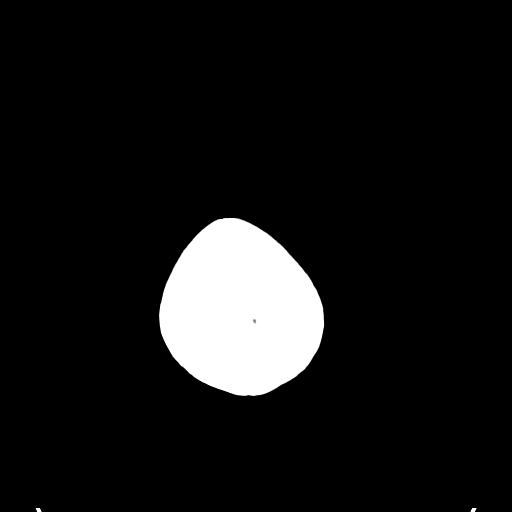
[im 32/34  brain]
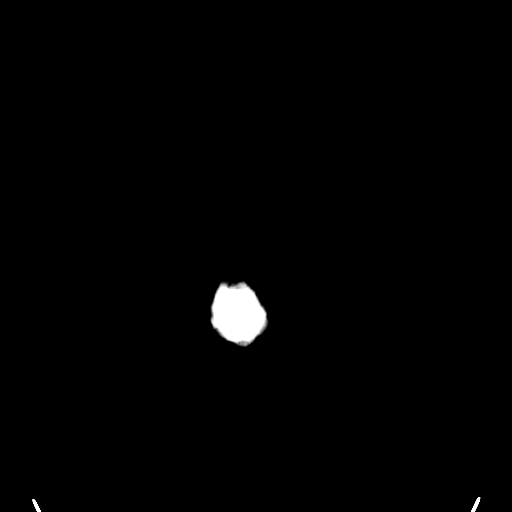

[16 of 30 positions shown; findings below may reference images not displayed]

FINDINGS: There is no evidence of acute hemorrhage. There is no evidence of
extra-axial fluid collections. Diffuse cortical atrophy is
appreciated. Areas of low attenuation project within the
subcortical, deep, and periventricular white matter regions. There
is global atrophy. Hydrocephalus ex vacuo, mild, is appreciated.
There is no evidence of mass effect. The osseous structures
demonstrate no evidence of a depressed skull fracture. The
visualized paranasal sinuses and mastoid air cells are patent.
IMPRESSION: Involutional changes without evidence of focal or acute intracranial
abnormalities.
